# Patient Record
Sex: Female | Born: 1965 | Race: Black or African American | Hispanic: No | Marital: Single | Smoking: Never smoker
Health system: Southern US, Community
[De-identification: ages and names within clinical notes are randomized; demographics above are authoritative.]

## PROBLEM LIST (undated history)

## (undated) ENCOUNTER — Ambulatory Visit (HOSPITAL_COMMUNITY): Admission: EM | Discharge status: Absent without leave | Payer: 59

## (undated) DIAGNOSIS — D649 Anemia, unspecified: Secondary | ICD-10-CM

## (undated) DIAGNOSIS — T7840XA Allergy, unspecified, initial encounter: Secondary | ICD-10-CM

## (undated) DIAGNOSIS — Z86010 Personal history of colonic polyps: Secondary | ICD-10-CM

## (undated) HISTORY — PX: OTHER SURGICAL HISTORY: SHX169

## (undated) HISTORY — DX: Anemia, unspecified: D64.9

## (undated) HISTORY — DX: Personal history of colonic polyps: Z86.010

## (undated) HISTORY — DX: Allergy, unspecified, initial encounter: T78.40XA

---

## 2002-05-20 ENCOUNTER — Other Ambulatory Visit: Admission: RE | Admit: 2002-05-20 | Discharge: 2002-05-20 | Payer: Self-pay | Admitting: Obstetrics and Gynecology

## 2003-06-05 ENCOUNTER — Encounter: Payer: Self-pay | Admitting: Obstetrics and Gynecology

## 2003-06-05 ENCOUNTER — Encounter: Admission: RE | Admit: 2003-06-05 | Discharge: 2003-06-05 | Payer: Self-pay | Admitting: Obstetrics and Gynecology

## 2003-07-25 ENCOUNTER — Encounter: Payer: Self-pay | Admitting: Obstetrics and Gynecology

## 2003-07-25 ENCOUNTER — Encounter: Admission: RE | Admit: 2003-07-25 | Discharge: 2003-07-25 | Payer: Self-pay | Admitting: Obstetrics and Gynecology

## 2004-07-01 ENCOUNTER — Other Ambulatory Visit: Admission: RE | Admit: 2004-07-01 | Discharge: 2004-07-01 | Payer: Self-pay | Admitting: Obstetrics and Gynecology

## 2005-05-15 ENCOUNTER — Encounter: Admission: RE | Admit: 2005-05-15 | Discharge: 2005-05-15 | Payer: Self-pay | Admitting: Obstetrics and Gynecology

## 2005-12-31 ENCOUNTER — Encounter: Admission: RE | Admit: 2005-12-31 | Discharge: 2005-12-31 | Payer: Self-pay | Admitting: Obstetrics and Gynecology

## 2006-01-19 ENCOUNTER — Observation Stay (HOSPITAL_COMMUNITY): Admission: RE | Admit: 2006-01-19 | Discharge: 2006-01-20 | Payer: Self-pay | Admitting: Interventional Radiology

## 2006-01-28 ENCOUNTER — Encounter: Admission: RE | Admit: 2006-01-28 | Discharge: 2006-01-28 | Payer: Self-pay | Admitting: Interventional Radiology

## 2006-02-20 ENCOUNTER — Encounter: Admission: RE | Admit: 2006-02-20 | Discharge: 2006-02-20 | Payer: Self-pay | Admitting: Interventional Radiology

## 2006-05-14 ENCOUNTER — Encounter: Admission: RE | Admit: 2006-05-14 | Discharge: 2006-05-14 | Payer: Self-pay | Admitting: Obstetrics and Gynecology

## 2006-07-08 ENCOUNTER — Encounter: Admission: RE | Admit: 2006-07-08 | Discharge: 2006-07-08 | Payer: Self-pay | Admitting: Obstetrics and Gynecology

## 2006-08-20 ENCOUNTER — Encounter: Admission: RE | Admit: 2006-08-20 | Discharge: 2006-08-20 | Payer: Self-pay | Admitting: Interventional Radiology

## 2007-03-24 ENCOUNTER — Encounter: Admission: RE | Admit: 2007-03-24 | Discharge: 2007-03-24 | Payer: Self-pay | Admitting: Interventional Radiology

## 2007-05-07 ENCOUNTER — Encounter: Admission: RE | Admit: 2007-05-07 | Discharge: 2007-05-07 | Payer: Self-pay | Admitting: Internal Medicine

## 2010-03-12 ENCOUNTER — Encounter: Payer: Self-pay | Admitting: Internal Medicine

## 2010-03-20 ENCOUNTER — Encounter: Payer: Self-pay | Admitting: Internal Medicine

## 2010-03-26 ENCOUNTER — Encounter: Payer: Self-pay | Admitting: Internal Medicine

## 2010-03-27 ENCOUNTER — Encounter (INDEPENDENT_AMBULATORY_CARE_PROVIDER_SITE_OTHER): Payer: Self-pay | Admitting: *Deleted

## 2010-03-29 ENCOUNTER — Ambulatory Visit: Payer: Self-pay | Admitting: Internal Medicine

## 2010-03-29 DIAGNOSIS — D649 Anemia, unspecified: Secondary | ICD-10-CM | POA: Insufficient documentation

## 2010-04-04 ENCOUNTER — Ambulatory Visit: Payer: Self-pay | Admitting: Internal Medicine

## 2010-04-05 LAB — CONVERTED CEMR LAB
Fecal Occult Blood: NEGATIVE
OCCULT 1: NEGATIVE

## 2010-09-27 ENCOUNTER — Emergency Department (HOSPITAL_COMMUNITY)
Admission: EM | Admit: 2010-09-27 | Discharge: 2010-09-27 | Payer: Self-pay | Source: Home / Self Care | Admitting: Emergency Medicine

## 2010-11-09 ENCOUNTER — Encounter: Payer: Self-pay | Admitting: Interventional Radiology

## 2010-11-10 ENCOUNTER — Encounter: Payer: Self-pay | Admitting: Obstetrics and Gynecology

## 2010-11-19 NOTE — Letter (Signed)
Summary: Pt Medical Hx/[facility]  Pt Medical Hx/[facility]   Imported By: Sherian Rein 04/03/2010 13:55:41  _____________________________________________________________________  External Attachment:    Type:   Image     Comment:   External Document

## 2010-11-19 NOTE — Assessment & Plan Note (Signed)
Summary: ANEMIA/YF   History of Present Illness Visit Type: Initial Consult Primary GI MD: Stan Head MD [facility] Primary Provider: Creola Corn, MD Requesting Provider: Creola Corn, MD Chief Complaint: anemia History of Present Illness:   45 yo African-american woman found to be anemic. Hemosure test was negative for occult blood. she has a history of anemia due to menorrhagia that was treated with endometrial ablation, successfully (2007). she is describing some RUQ pains intermittently associated with twisting or bending for 6 years    GI Review of Systems    Reports abdominal pain.     Location of  Abdominal pain: RUQ.    Denies acid reflux, belching, bloating, chest pain, dysphagia with liquids, dysphagia with solids, heartburn, loss of appetite, nausea, vomiting, vomiting blood, weight loss, and  weight gain.        Denies anal fissure, black tarry stools, change in bowel habit, constipation, diarrhea, diverticulosis, fecal incontinence, heme positive stool, hemorrhoids, irritable bowel syndrome, jaundice, light color stool, liver problems, rectal bleeding, and  rectal pain. Preventive Screening-Counseling & Management      Drug Use:  no.      Current Medications (verified): 1)  Multivitamins  Tabs (Multiple Vitamin) .... Take 1 Tablet By Mouth Once A Day 2)  Wheat Germ Oil  Caps (Wheat Germ Oil) .Marland Kitchen.. 1 By Mouth Once Daily - Every Other Day 3)  Vitamin D3 5000 Unit Caps (Cholecalciferol) .Marland Kitchen.. 1 By Mouth Once Daily 4)  Biotin 5000 5 Mg Caps (Biotin) .Marland Kitchen.. 1 By Mouth As Needed  Allergies (verified): No Known Drug Allergies  Past History:  Past Medical History: Reviewed history from 03/28/2010 and no changes required. Anemia Arthritis Atrial Fibrillation Obesity Ventricular Hypertrophy  Past Surgical History: Reviewed history from 03/28/2010 and no changes required. C-Section x 3 Fibroid embolization  Family History: Reviewed history from 03/28/2010 and no changes  required. Family History of Mouth Cancer: Father No FH of Colon Cancer:  Social History: Reviewed history from 03/28/2010 and no changes required. 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  Alcohol Use - yes-once a month Illicit Drug Use - no Drug Use:  no  Review of Systems       The patient complains of cough.         All other ROS negative except as per HPI.   Vital Signs:  Patient profile:   45 year old female Height:      65 inches Weight:      233 pounds BMI:     38.91 Pulse rate:   80 / minute Pulse rhythm:   regular BP sitting:   118 / 76  (left arm)  Vitals Entered By: Milford Cage NCMA (March 29, 2010 10:30 AM)  Physical Exam  General:  obese.  NAD Eyes:  PERRLA, no icterus. Mouth:  No deformity or lesions, dentition normal. Neck:  Supple; no masses or thyromegaly. Lungs:  Clear throughout to auscultation. Heart:  Regular rate and rhythm; no murmurs, rubs,  or bruits. Abdomen:  Soft, nontender and nondistended. No masses, hepatosplenomegaly or hernias noted. Normal bowel sounds. Obese Extremities:  No clubbing, cyanosis, edema or deformities noted. Neurologic:  Alert and  oriented x4;  grossly normal neurologically. Cervical Nodes:  No significant cervical or supraclavicular adenopathy.  Psych:  Alert and cooperative. Normal mood and affect.   Impression & Recommendations:  Problem # 1:  ANEMIA, MILD (ICD-285.9) Assessment New She has a mild chronic anemia with Hgb in 11 range. Hemosure is negative (  suggests no colonic occult bleeding), iron studies ok, and B12, Folate, retics, Hgb electrophoresis have been done. We will follow-up on those results and alos have her do guaic-based occult blood testing to look for bleeding from upper GI tract and small bowel that would not be picked by Hemosure iFOBT.  At this point there is no indication for an endoscopic evaluation.  Orders: [facility] Hemoccult Cards #6 (take home) (Hem  Cards #6)  Problem # 2:  SCREENING, COLON CANCER (ICD-V76.51) Assessment: New A colonoscopy at 44 in African-Americans is reasonable given ACG recommendations and the earlier onset of polyps/cancer of colon in blacks. Await other testing, etc. she will be 44 in 2 months.  Patient Instructions: 1)  Return Hemoccult cards to lab.  We will call you with results and follow up. 2)  Copy sent to : Creola Corn, MD 3)  The medication list was reviewed and reconciled.  All changed / newly prescribed medications were explained.  A complete medication list was provided to the patient / caregiver.

## 2010-11-19 NOTE — Letter (Signed)
Summary: [facility]  [facility]   Imported By: Sherian Rein 04/03/2010 13:53:59  _____________________________________________________________________  External Attachment:    Type:   Image     Comment:   External Document

## 2010-11-19 NOTE — Letter (Signed)
Summary: New Patient letter  [facility]  [address] [place], [place] [address]   Phone: [phone]  Fax: [phone]       03/27/2010 MRN: [id number]  Meghan Robertson [address] [place], [place]  [address]  Dear Meghan Robertson,  Welcome to the Gastroenterology Division at [facility].    You are scheduled to see Dr.  Leone Payor on 03-29-10 at 10am on the 3rd floor at [facility], [address]  We ask that you try to arrive at our office 15 minutes prior to your appointment time to allow for check-in.  We would like you to complete the enclosed self-administered evaluation form prior to your visit and bring it with you on the day of your appointment.  We will review it with you.  Also, please bring a complete list of all your medications or, if you prefer, bring the medication bottles and we will list them.  Please bring your insurance card so that we may make a copy of it.  If your insurance requires a referral to see a specialist, please bring your referral form from your primary care physician.  Co-payments are due at the time of your visit and may be paid by cash, check or credit card.     Your office visit will consist of a consult with your physician (includes a physical exam), any laboratory testing he/she may order, scheduling of any necessary diagnostic testing (e.g. x-ray, ultrasound, CT-scan), and scheduling of a procedure (e.g. Endoscopy, Colonoscopy) if required.  Please allow enough time on your schedule to allow for any/all of these possibilities.    If you cannot keep your appointment, please call [phone] to cancel or reschedule prior to your appointment date.  This allows [place] the opportunity to schedule an appointment for another patient in need of care.  If you do not cancel or reschedule by 5 p.m. the business day prior to your appointment date, you will be charged a $50.00 late cancellation/no-show fee.    Thank you for choosing [facility] for your medical needs.  We appreciate the opportunity to care for you.  Please visit [place] at our website  to learn more about our practice.                     Sincerely,                                                             The Gastroenterology Division

## 2013-08-19 ENCOUNTER — Other Ambulatory Visit: Payer: Self-pay

## 2013-08-19 ENCOUNTER — Encounter (HOSPITAL_COMMUNITY): Payer: Self-pay | Admitting: Emergency Medicine

## 2013-08-19 ENCOUNTER — Emergency Department (HOSPITAL_COMMUNITY)
Admission: EM | Admit: 2013-08-19 | Discharge: 2013-08-20 | Disposition: A | Discharge status: Home / Self Care | Payer: Managed Care, Other (non HMO) | Attending: Emergency Medicine | Admitting: Emergency Medicine

## 2013-08-19 ENCOUNTER — Emergency Department (HOSPITAL_COMMUNITY): Payer: Managed Care, Other (non HMO)

## 2013-08-19 DIAGNOSIS — Z3202 Encounter for pregnancy test, result negative: Secondary | ICD-10-CM | POA: Insufficient documentation

## 2013-08-19 DIAGNOSIS — R079 Chest pain, unspecified: Secondary | ICD-10-CM | POA: Insufficient documentation

## 2013-08-19 DIAGNOSIS — Z79899 Other long term (current) drug therapy: Secondary | ICD-10-CM | POA: Insufficient documentation

## 2013-08-19 DIAGNOSIS — M549 Dorsalgia, unspecified: Secondary | ICD-10-CM | POA: Insufficient documentation

## 2013-08-19 LAB — URINALYSIS, ROUTINE W REFLEX MICROSCOPIC
Bilirubin Urine: NEGATIVE
Glucose, UA: NEGATIVE mg/dL
Ketones, ur: NEGATIVE mg/dL
Leukocytes, UA: NEGATIVE
Nitrite: NEGATIVE
Protein, ur: NEGATIVE mg/dL
Specific Gravity, Urine: 1.032 — ABNORMAL HIGH (ref 1.005–1.030)
Urobilinogen, UA: 1 mg/dL (ref 0.0–1.0)
pH: 5.5 (ref 5.0–8.0)

## 2013-08-19 LAB — POCT PREGNANCY, URINE: Preg Test, Ur: NEGATIVE

## 2013-08-19 LAB — CBC WITH DIFFERENTIAL/PLATELET
Basophils Absolute: 0 10*3/uL (ref 0.0–0.1)
Eosinophils Relative: 4 % (ref 0–5)
Hemoglobin: 11.8 g/dL — ABNORMAL LOW (ref 12.0–15.0)
Lymphocytes Relative: 38 % (ref 12–46)
Lymphs Abs: 2.8 10*3/uL (ref 0.7–4.0)
MCHC: 33.2 g/dL (ref 30.0–36.0)
Monocytes Absolute: 0.4 10*3/uL (ref 0.1–1.0)
Monocytes Relative: 6 % (ref 3–12)
Neutrophils Relative %: 53 % (ref 43–77)

## 2013-08-19 LAB — POCT I-STAT, CHEM 8
Calcium, Ion: 1.23 mmol/L (ref 1.12–1.23)
Chloride: 105 mEq/L (ref 96–112)
HCT: 38 % (ref 36.0–46.0)
Hemoglobin: 12.9 g/dL (ref 12.0–15.0)
Sodium: 141 mEq/L (ref 135–145)

## 2013-08-19 LAB — COMPREHENSIVE METABOLIC PANEL
ALT: 22 U/L (ref 0–35)
AST: 22 U/L (ref 0–37)
Calcium: 9.9 mg/dL (ref 8.4–10.5)
Chloride: 102 mEq/L (ref 96–112)
Sodium: 137 mEq/L (ref 135–145)
Total Bilirubin: 0.2 mg/dL — ABNORMAL LOW (ref 0.3–1.2)
Total Protein: 8 g/dL (ref 6.0–8.3)

## 2013-08-19 MED ORDER — GI COCKTAIL ~~LOC~~
30.0000 mL | Freq: Once | ORAL | Status: AC
  Administered 2013-08-19: 30 mL via ORAL
  Filled 2013-08-19: qty 30

## 2013-08-19 MED ORDER — OMEPRAZOLE 20 MG PO CPDR: DELAYED_RELEASE_CAPSULE | ORAL | Status: DC

## 2013-08-19 NOTE — ED Notes (Signed)
Patient transported to X-ray 

## 2013-08-19 NOTE — ED Provider Notes (Signed)
CSN: [id number]     Arrival date & time 08/19/13  2138 History   First MD Initiated Contact with Patient 08/19/13 2224     Chief Complaint  Patient presents with  . Chest Pain  . Back Pain   (Consider location/radiation/quality/duration/timing/severity/associated sxs/prior Treatment) HPI Comments: Patient with no significant past medical history presents with complaint of chest pain with radiation to her back that is been intermittent for one year but constant for the past 3 days. Patient is described as being in the middle of her chest radiating to her middle back and is burning in nature. She denies vomiting but has had mild nausea. No diarrhea or urinary symptoms. No shortness of breath or palpitations. Patient denies risk factors for PE including recent immobilizations, history of blood clots, estrogen use, lower extremity swelling, travel. Patient denies history of high blood pressure, high cholesterol, diabetes, smoking. No family history of heart attack or heart disease. Patient has taken aspirin for the pain but does not take every day. Patient has taken cayenne pepper with some relief. Patient has not noted change of pain with food or deep breathing. She does not endorse heavy NSAID use and does not drink alcohol. The onset of this condition was acute. The course is constant. Aggravating factors: none.   The history is provided by the patient.    History reviewed. No pertinent past medical history. Past Surgical History  Procedure Laterality Date  . Cesarean section      x 3  . Embolization      fibroid   No family history on file. History  Substance Use Topics  . Smoking status: Never Smoker   . Smokeless tobacco: Not on file  . Alcohol Use: No   OB History   Grav Para Term Preterm Abortions TAB SAB Ect Mult Living                 Review of Systems  Constitutional: Negative for fever and diaphoresis.  Eyes: Negative for redness.  Respiratory: Negative for cough and  shortness of breath.   Cardiovascular: Positive for chest pain. Negative for palpitations and leg swelling.  Gastrointestinal: Negative for nausea, vomiting and abdominal pain.  Genitourinary: Negative for dysuria.  Musculoskeletal: Positive for back pain. Negative for neck pain.  Skin: Negative for rash.  Neurological: Negative for syncope and light-headedness.    Allergies  Review of patient's allergies indicates no known allergies.  Home Medications   Current Outpatient Rx  Name  Route  Sig  Dispense  Refill  . aspirin 81 MG chewable tablet   Oral   Chew 81 mg by mouth once as needed (chest pain).         . cholecalciferol (VITAMIN D) 1000 UNITS tablet   Oral   Take 1,000 Units by mouth every morning.         . vitamin C (ASCORBIC ACID) 500 MG tablet   Oral   Take 500 mg by mouth every morning.          BP 125/75  Pulse 71  Temp(Src) 98.7 F (37.1 C) (Oral)  Resp 18  Ht 5\' 5"  (1.651 m)  Wt 235 lb (106.595 kg)  BMI 39.11 kg/m2  SpO2 100% Physical Exam  Nursing note and vitals reviewed. Constitutional: She appears well-developed and well-nourished.  HENT:  Head: Normocephalic and atraumatic.  Mouth/Throat: Mucous membranes are normal. Mucous membranes are not dry.  Eyes: Conjunctivae are normal.  Neck: Trachea normal and normal range of motion.  Neck supple. Normal carotid pulses and no JVD present. No muscular tenderness present. Carotid bruit is not present. No tracheal deviation present.  Cardiovascular: Normal rate, regular rhythm, S1 normal, S2 normal, normal heart sounds and intact distal pulses.  Exam reveals no decreased pulses.   No murmur heard. Pulmonary/Chest: Effort normal. No respiratory distress. She has no wheezes. She exhibits no tenderness.  Abdominal: Soft. Normal aorta and bowel sounds are normal. There is no tenderness. There is no rebound and no guarding.  Musculoskeletal: Normal range of motion. She exhibits no edema and no tenderness.   Neurological: She is alert.  Skin: Skin is warm and dry. She is not diaphoretic. No cyanosis. No pallor.  Psychiatric: She has a normal mood and affect.    ED Course  Procedures (including critical care time) Labs Review Labs Reviewed  CBC WITH DIFFERENTIAL - Abnormal; Notable for the following:    Hemoglobin 11.8 (*)    HCT 35.5 (*)    All other components within normal limits  URINALYSIS, ROUTINE W REFLEX MICROSCOPIC - Abnormal; Notable for the following:    Specific Gravity, Urine 1.032 (*)    All other components within normal limits  COMPREHENSIVE METABOLIC PANEL - Abnormal; Notable for the following:    Total Bilirubin 0.2 (*)    All other components within normal limits  LIPASE, BLOOD - Abnormal; Notable for the following:    Lipase 68 (*)    All other components within normal limits  POCT PREGNANCY, URINE  POCT I-STAT, CHEM 8  POCT I-STAT TROPONIN I   Imaging Review Dg Chest 2 View  08/19/2013   CLINICAL DATA:  Chest pain  EXAM: CHEST  2 VIEW  COMPARISON:  None.  FINDINGS: The heart size and mediastinal contours are within normal limits. Both lungs are clear of edema or consolidation. No effusion or pneumothorax. The visualized skeletal structures are unremarkable.  IMPRESSION: No evidence of active cardiopulmonary disease.   Electronically Signed   By: Tiburcio Pea M.D.   On: 08/19/2013 23:06    EKG Interpretation     Ventricular Rate:  76 PR Interval:  154 QRS Duration: 83 QT Interval:  391 QTC Calculation: 440 R Axis:   76 Text Interpretation:  Sinus rhythm           Patient seen and examined. Work-up initiated. Medications ordered.   Vital signs reviewed and are as follows: Filed Vitals:   08/19/13 2345  BP: 111/55  Pulse:   Temp:   Resp: 18  BP 111/55  Pulse 71  Temp(Src) 98.7 F (37.1 C) (Oral)  Resp 18  Ht 5\' 5"  (1.651 m)  Wt 235 lb (106.595 kg)  BMI 39.11 kg/m2  SpO2 100%  11:50 PM Patient states that GI cocktail made her a  little nauseous, did not change pain. Informed of all results. Offered pain medicine, she refuses. Patient wants to go home.   Will start on PPI. Will give f/u with [facility]. She does not want pain meds for home.   Patient was counseled to return with severe chest pain, especially if the pain is crushing or pressure-like and spreads to the arms, back, neck, or jaw, or if they have sweating, nausea, or shortness of breath with the pain. They were encouraged to call 911 with these symptoms.   They were also told to return if their chest pain gets worse and does not go away with rest, they have an attack of chest pain lasting longer than  usual despite rest and treatment with the medications their caregiver has prescribed, if they wake from sleep with chest pain or shortness of breath, if they feel dizzy or faint, if they have chest pain not typical of their usual pain, or if they have any other emergent concerns regarding their health.  The patient verbalized understanding and agreed.     MDM   1. Chest pain    Patient with chest pain, constant x 72 hrs, intermittent for past year. Doubt cardiac origin of pain: poor story, normal EKG, neg trop. Doubt PE, pt is PERC negative.CXR neg. Slight elevation in lipase but history not suggestive of pancreatitis. Cholelithiasis is possible but no change in pain with food. PUD also possible. Will start on PPI empirically. No emergency medical conditions are suspected. Pt to f/u with [facility] for further eval.    Renne Crigler, PA-C 08/19/13 2357

## 2013-08-19 NOTE — ED Notes (Signed)
Pt c/o substernal burning, aching radiating through to back x 3 days. Pt states she has had periods of diaphoresis with SHOB.

## 2013-08-22 NOTE — ED Provider Notes (Signed)
Medical screening examination/treatment/procedure(s) were performed by non-physician practitioner and as supervising physician I was immediately available for consultation/collaboration.  EKG Interpretation     Ventricular Rate:  76 PR Interval:  154 QRS Duration: 83 QT Interval:  391 QTC Calculation: 440 R Axis:   76 Text Interpretation:  Sinus rhythm              Laray Anger, DO 08/22/13 0123

## 2013-10-11 ENCOUNTER — Ambulatory Visit: Payer: Self-pay | Admitting: Internal Medicine

## 2013-10-19 ENCOUNTER — Ambulatory Visit: Payer: Self-pay

## 2013-12-22 ENCOUNTER — Other Ambulatory Visit (HOSPITAL_COMMUNITY)
Admission: RE | Admit: 2013-12-22 | Discharge: 2013-12-22 | Disposition: A | Discharge status: Home / Self Care | Payer: Managed Care, Other (non HMO) | Source: Ambulatory Visit | Attending: Obstetrics and Gynecology | Admitting: Obstetrics and Gynecology

## 2013-12-22 ENCOUNTER — Other Ambulatory Visit: Payer: Self-pay | Admitting: Obstetrics and Gynecology

## 2013-12-22 DIAGNOSIS — Z1151 Encounter for screening for human papillomavirus (HPV): Secondary | ICD-10-CM | POA: Insufficient documentation

## 2013-12-22 DIAGNOSIS — Z01419 Encounter for gynecological examination (general) (routine) without abnormal findings: Secondary | ICD-10-CM | POA: Insufficient documentation

## 2016-02-06 ENCOUNTER — Other Ambulatory Visit: Payer: Self-pay

## 2016-02-06 DIAGNOSIS — Z1231 Encounter for screening mammogram for malignant neoplasm of breast: Secondary | ICD-10-CM

## 2016-02-11 ENCOUNTER — Other Ambulatory Visit: Payer: Self-pay | Admitting: Obstetrics and Gynecology

## 2016-02-11 ENCOUNTER — Other Ambulatory Visit (HOSPITAL_COMMUNITY)
Admission: RE | Admit: 2016-02-11 | Discharge: 2016-02-11 | Disposition: A | Discharge status: Home / Self Care | Payer: 59 | Source: Ambulatory Visit | Attending: Obstetrics and Gynecology | Admitting: Obstetrics and Gynecology

## 2016-02-11 DIAGNOSIS — Z01419 Encounter for gynecological examination (general) (routine) without abnormal findings: Secondary | ICD-10-CM | POA: Diagnosis present

## 2016-02-12 LAB — CYTOLOGY - PAP

## 2016-02-18 ENCOUNTER — Other Ambulatory Visit: Payer: Self-pay | Admitting: Internal Medicine

## 2016-02-18 DIAGNOSIS — R229 Localized swelling, mass and lump, unspecified: Principal | ICD-10-CM

## 2016-02-18 DIAGNOSIS — IMO0002 Reserved for concepts with insufficient information to code with codable children: Secondary | ICD-10-CM

## 2016-02-27 ENCOUNTER — Ambulatory Visit
Admission: RE | Admit: 2016-02-27 | Discharge: 2016-02-27 | Disposition: A | Discharge status: Home / Self Care | Payer: 59 | Source: Ambulatory Visit | Attending: Internal Medicine | Admitting: Internal Medicine

## 2016-02-27 ENCOUNTER — Ambulatory Visit
Admission: RE | Admit: 2016-02-27 | Discharge: 2016-02-27 | Disposition: A | Discharge status: Home / Self Care | Payer: 59 | Source: Ambulatory Visit

## 2016-02-27 DIAGNOSIS — Z1231 Encounter for screening mammogram for malignant neoplasm of breast: Secondary | ICD-10-CM

## 2016-02-27 DIAGNOSIS — R229 Localized swelling, mass and lump, unspecified: Principal | ICD-10-CM

## 2016-02-27 DIAGNOSIS — IMO0002 Reserved for concepts with insufficient information to code with codable children: Secondary | ICD-10-CM

## 2016-12-24 ENCOUNTER — Other Ambulatory Visit: Payer: Self-pay | Admitting: Occupational Medicine

## 2016-12-24 ENCOUNTER — Ambulatory Visit: Payer: Self-pay

## 2016-12-24 DIAGNOSIS — M549 Dorsalgia, unspecified: Secondary | ICD-10-CM

## 2017-02-16 ENCOUNTER — Encounter: Payer: Self-pay | Admitting: Internal Medicine

## 2017-03-02 ENCOUNTER — Ambulatory Visit: Payer: No Typology Code available for payment source | Admitting: Podiatry

## 2017-03-19 ENCOUNTER — Ambulatory Visit: Payer: No Typology Code available for payment source | Admitting: Podiatry

## 2017-03-26 ENCOUNTER — Other Ambulatory Visit: Payer: Self-pay | Admitting: Specialist

## 2017-03-26 DIAGNOSIS — M5431 Sciatica, right side: Secondary | ICD-10-CM

## 2017-04-02 ENCOUNTER — Other Ambulatory Visit: Payer: Self-pay

## 2017-04-03 ENCOUNTER — Inpatient Hospital Stay
Admission: RE | Admit: 2017-04-03 | Discharge: 2017-04-03 | Disposition: A | Discharge status: Home / Self Care | Payer: Self-pay | Source: Ambulatory Visit | Attending: Specialist | Admitting: Specialist

## 2017-04-07 ENCOUNTER — Inpatient Hospital Stay
Admission: RE | Admit: 2017-04-07 | Discharge: 2017-04-07 | Disposition: A | Discharge status: Home / Self Care | Payer: Self-pay | Source: Ambulatory Visit | Attending: Specialist | Admitting: Specialist

## 2017-04-07 NOTE — Discharge Instructions (Signed)

## 2017-04-08 ENCOUNTER — Encounter: Payer: Self-pay | Admitting: Podiatry

## 2017-04-08 ENCOUNTER — Ambulatory Visit (INDEPENDENT_AMBULATORY_CARE_PROVIDER_SITE_OTHER): Payer: No Typology Code available for payment source | Admitting: Podiatry

## 2017-04-08 VITALS — BP 138/83 | HR 75 | Resp 16 | Ht 65.0 in | Wt 240.0 lb

## 2017-04-08 DIAGNOSIS — B351 Tinea unguium: Secondary | ICD-10-CM | POA: Diagnosis not present

## 2017-04-08 LAB — HEPATIC FUNCTION PANEL
ALBUMIN: 4 g/dL (ref 3.6–5.1)
ALT: 15 U/L (ref 6–29)
AST: 15 U/L (ref 10–35)
Alkaline Phosphatase: 56 U/L (ref 33–130)
BILIRUBIN DIRECT: 0.1 mg/dL (ref <=0.2)
BILIRUBIN TOTAL: 0.4 mg/dL (ref 0.2–1.2)
Indirect Bilirubin: 0.3 mg/dL (ref 0.2–1.2)
Total Protein: 7.1 g/dL (ref 6.1–8.1)

## 2017-04-08 MED ORDER — TERBINAFINE HCL 250 MG PO TABS: 250.0000 mg | ORAL_TABLET | Freq: Every day | ORAL | 0 refills | Status: DC

## 2017-04-08 NOTE — Progress Notes (Signed)
   Subjective:    Patient ID: Meghan Robertson, female    DOB: [birth year], 51 y.o.   MRN: [id number]  HPI Chief Complaint  Patient presents with  . Nail problrem    Bilateral; great toes; nail discoloration and thickened nail  . Skin condition    Left foot; skin peeling; itching; x1 yr      Review of Systems  Eyes: Positive for visual disturbance.  Musculoskeletal: Positive for back pain.  All other systems reviewed and are negative.      Objective:   Physical Exam        Assessment & Plan:

## 2017-04-08 NOTE — Progress Notes (Signed)
Subjective:    Patient ID: Meghan Robertson, female   DOB: 51 y.o.   MRN: [id number]   HPI patient states that she has significant fungal infection of the left hallux nail and it's she crusted skin on the left foot and several other nails that have yellow like discoloration.    Review of Systems  All other systems reviewed and are negative.       Objective:  Physical Exam  Cardiovascular: Intact distal pulses.   Musculoskeletal: Normal range of motion.  Neurological: She is alert.  Skin: Skin is warm.  Nursing note and vitals reviewed.  neurovascular status intact muscle strength adequate range of motion within normal limits with patient found to have should've a number of nails with the left hallux being worse with skin irritation left which is probably fungal in nature. 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 I've recommended antifungal and patient will do a full 90 day treatment I did explain procedure risk and we will get liver function studies done prior to initiation of treatment. I then recommended at least for laser treatments to be done focusing on the left hallux of several other nails will be touched up. Patient will be seen back for this procedure and also we'll start topical medication

## 2017-04-10 ENCOUNTER — Ambulatory Visit (AMBULATORY_SURGERY_CENTER): Payer: Self-pay | Admitting: *Deleted

## 2017-04-10 VITALS — Ht 65.0 in | Wt 242.6 lb

## 2017-04-10 DIAGNOSIS — Z1211 Encounter for screening for malignant neoplasm of colon: Secondary | ICD-10-CM

## 2017-04-10 NOTE — Progress Notes (Signed)
No egg or soy allergy known to patient  No issues with past sedation with any surgeries  or procedures, no intubation problems  No diet pills per patient No home 02 use per patient  No blood thinners per patient  Pt denies issues with constipation  No A fib or A flutter  EMMI video sent to pt's e mail  

## 2017-04-14 ENCOUNTER — Encounter: Payer: Self-pay | Admitting: Internal Medicine

## 2017-04-20 ENCOUNTER — Encounter: Payer: Self-pay | Admitting: Internal Medicine

## 2017-04-24 ENCOUNTER — Ambulatory Visit: Payer: No Typology Code available for payment source

## 2017-04-28 ENCOUNTER — Encounter: Payer: Self-pay | Admitting: Internal Medicine

## 2017-06-12 ENCOUNTER — Encounter: Payer: Self-pay | Admitting: Internal Medicine

## 2017-07-10 ENCOUNTER — Other Ambulatory Visit: Payer: Self-pay | Admitting: Internal Medicine

## 2017-07-10 DIAGNOSIS — Z1231 Encounter for screening mammogram for malignant neoplasm of breast: Secondary | ICD-10-CM

## 2017-07-16 ENCOUNTER — Ambulatory Visit
Admission: RE | Admit: 2017-07-16 | Discharge: 2017-07-16 | Disposition: A | Discharge status: Home / Self Care | Payer: PRIVATE HEALTH INSURANCE | Source: Ambulatory Visit | Attending: Internal Medicine | Admitting: Internal Medicine

## 2017-07-16 DIAGNOSIS — Z1231 Encounter for screening mammogram for malignant neoplasm of breast: Secondary | ICD-10-CM

## 2017-07-21 ENCOUNTER — Encounter: Payer: Self-pay | Admitting: Internal Medicine

## 2017-08-02 IMAGING — CR DG LUMBAR SPINE COMPLETE 4+V
5 series · 5 of 5 positions shown · non-contrast
Comparison: Lumbar spine films of 09/27/2010

CLINICAL DATA: Injured lifting several weeks ago with persistent
low back pain

EXAM:
LUMBAR SPINE - COMPLETE 4+ VIEW

[view not recorded (1 of 5)]
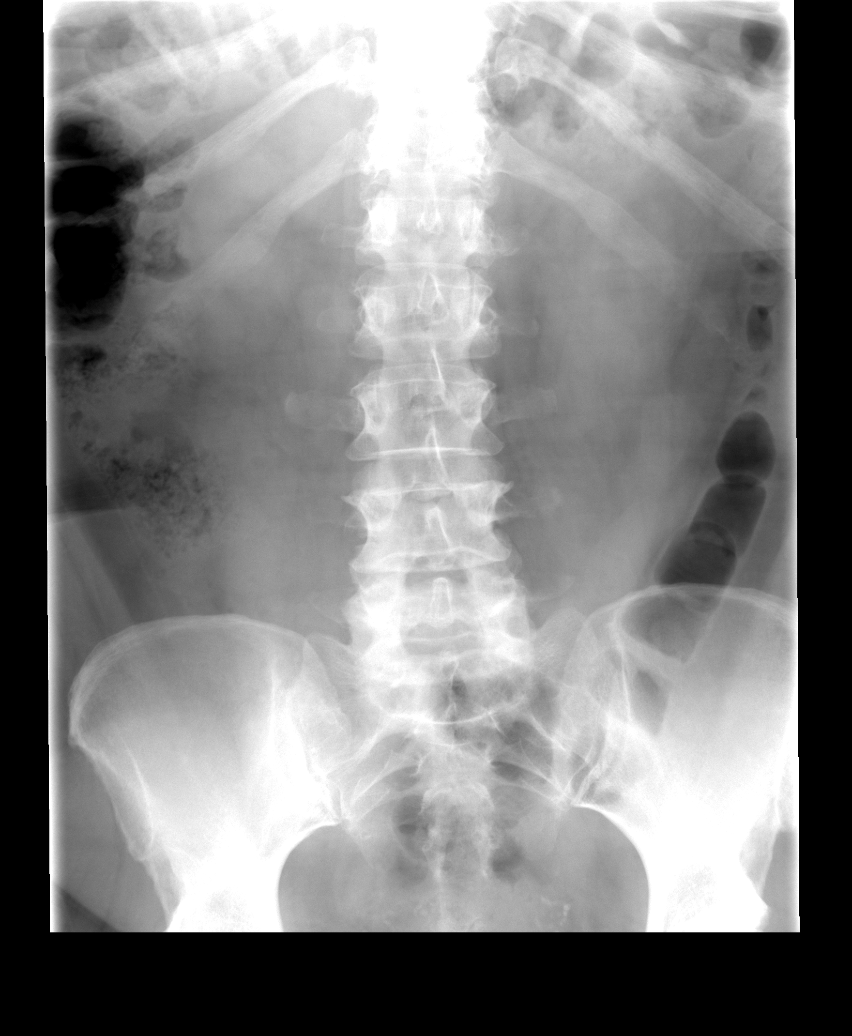

[view not recorded (2 of 5)]
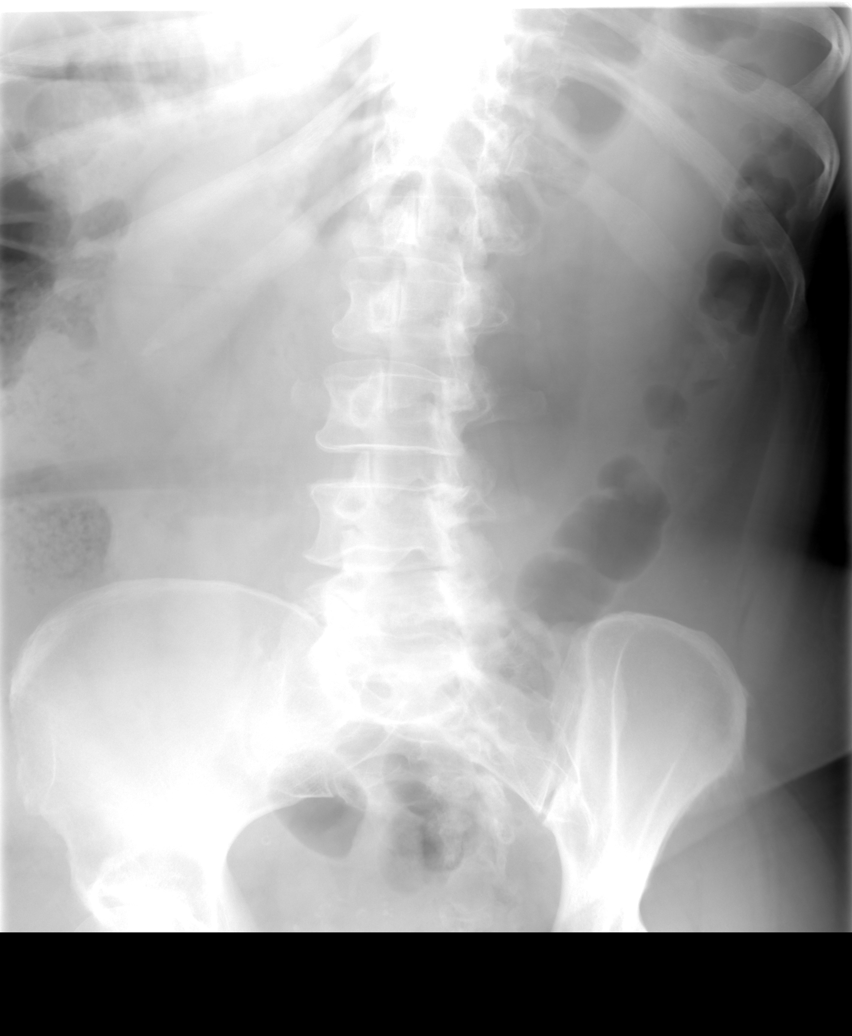

[view not recorded (3 of 5)]
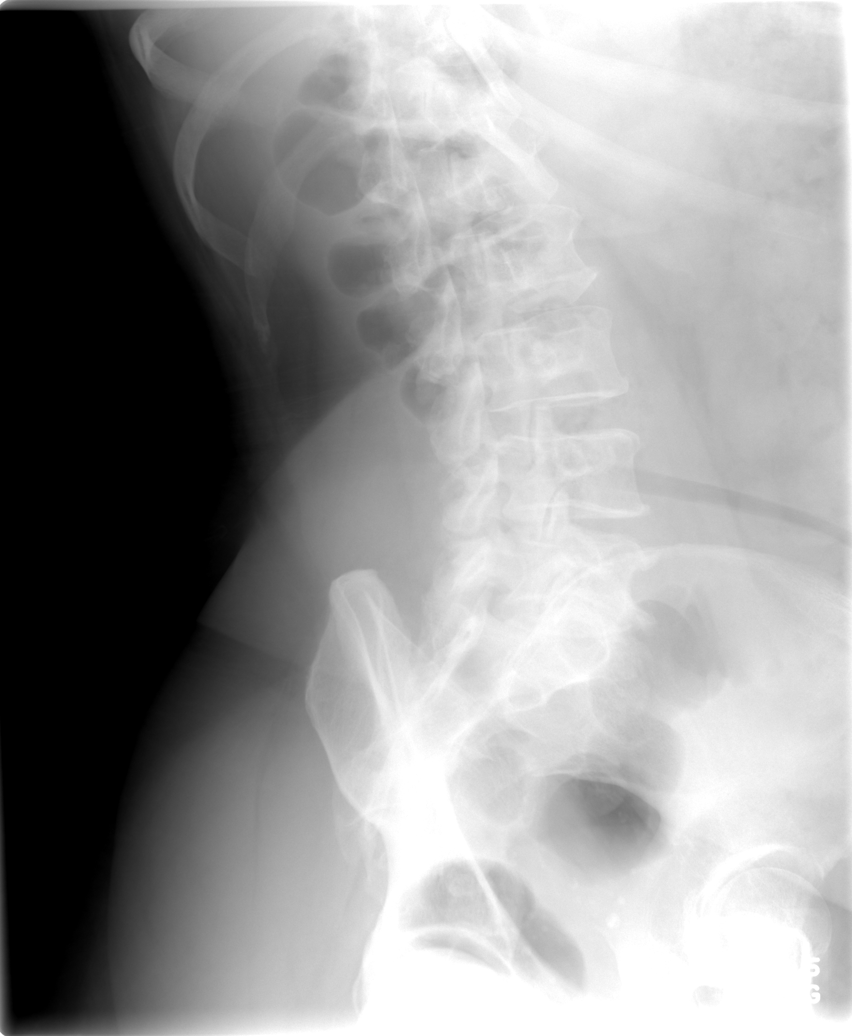

[view not recorded (4 of 5)]
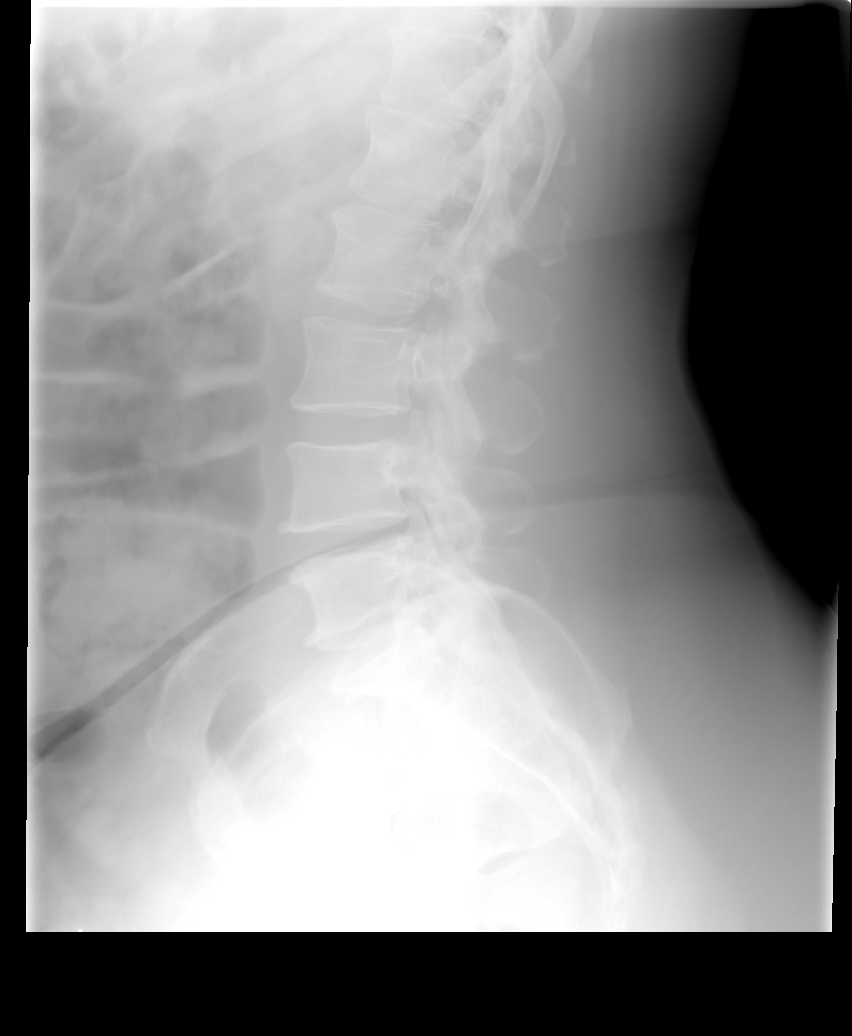

[view not recorded (5 of 5)]
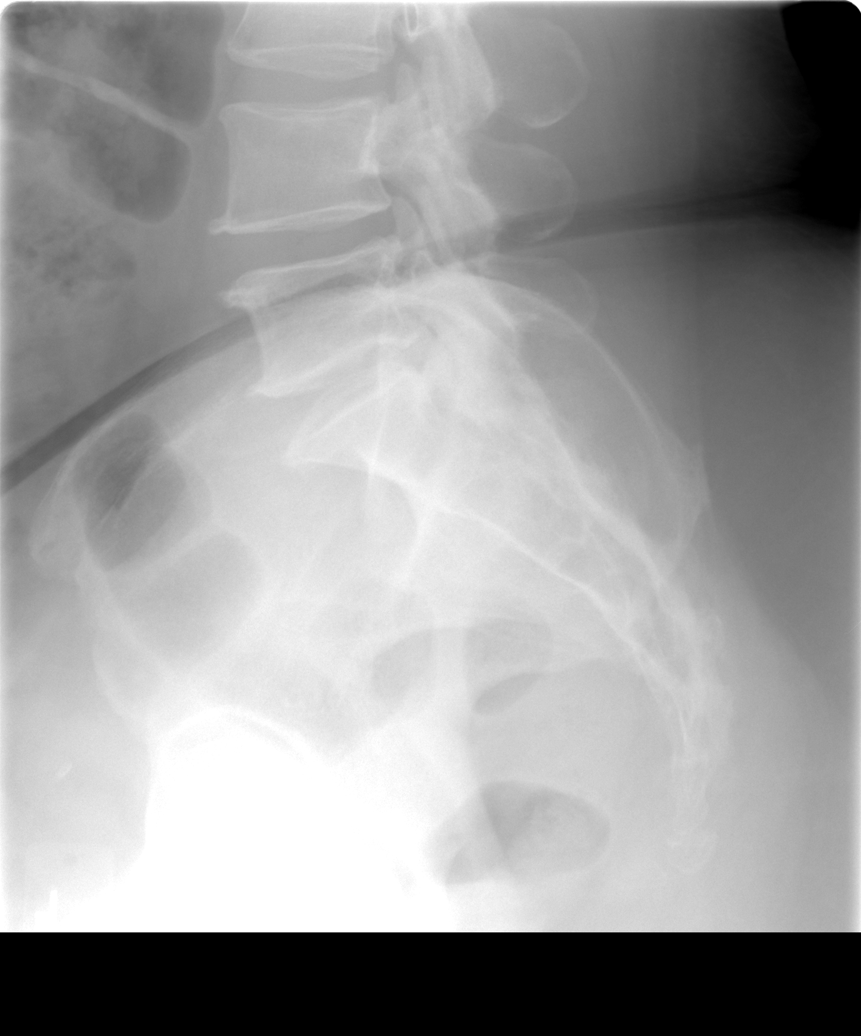

[5 of 5 positions shown; findings below may reference images not displayed]

FINDINGS: The lumbar vertebrae remain in normal alignment with normal
intervertebral disc spaces. No compression deformity is seen. Mild
anterior osteophyte formation is present at L4-5 and L5-S1. On
oblique views no significant abnormality of the facet joints is
seen. The SI joints are well corticated.
IMPRESSION: Normal alignment with no acute abnormality. Mild anterior osteophyte
formation at L4-5 and L5-S1.

## 2017-08-28 ENCOUNTER — Encounter: Payer: Self-pay | Admitting: Internal Medicine

## 2017-08-28 ENCOUNTER — Ambulatory Visit (AMBULATORY_SURGERY_CENTER): Payer: PRIVATE HEALTH INSURANCE | Admitting: Internal Medicine

## 2017-08-28 ENCOUNTER — Other Ambulatory Visit: Payer: Self-pay

## 2017-08-28 VITALS — BP 114/58 | HR 67 | Temp 96.0°F | Resp 14 | Ht 65.0 in | Wt 242.0 lb

## 2017-08-28 DIAGNOSIS — Z1212 Encounter for screening for malignant neoplasm of rectum: Secondary | ICD-10-CM | POA: Diagnosis not present

## 2017-08-28 DIAGNOSIS — D123 Benign neoplasm of transverse colon: Secondary | ICD-10-CM

## 2017-08-28 DIAGNOSIS — Z1211 Encounter for screening for malignant neoplasm of colon: Secondary | ICD-10-CM | POA: Diagnosis present

## 2017-08-28 MED ORDER — SODIUM CHLORIDE 0.9 % IV SOLN: 500.0000 mL | INTRAVENOUS | Status: DC

## 2017-08-28 NOTE — Progress Notes (Signed)
Called to room to assist during endoscopic procedure.  Patient ID and intended procedure confirmed with present staff. Received instructions for my participation in the procedure from the performing physician.  

## 2017-08-28 NOTE — Progress Notes (Signed)
Pt's states no medical or surgical changes since previsit or office visit. 

## 2017-08-28 NOTE — Patient Instructions (Addendum)
I found and removed one tiny polyp that looks benign.  I will let you know pathology results and when to have another routine colonoscopy by mail and/or My Chart.  I appreciate the opportunity to care for you. Gatha Mayer, MD, [facility]  Polyp handout given to patient. Return to previous diet. Continue present medications.  YOU HAD AN ENDOSCOPIC PROCEDURE TODAY AT [facility]:   Refer to the procedure report that was given to you for any specific questions about what was found during the examination.  If the procedure report does not answer your questions, please call your gastroenterologist to clarify.  If you requested that your care partner not be given the details of your procedure findings, then the procedure report has been included in a sealed envelope for you to review at your convenience later.  YOU SHOULD EXPECT: Some feelings of bloating in the abdomen. Passage of more gas than usual.  Walking can help get rid of the air that was put into your GI tract during the procedure and reduce the bloating. If you had a lower endoscopy (such as a colonoscopy or flexible sigmoidoscopy) you may notice spotting of blood in your stool or on the toilet paper. If you underwent a bowel prep for your procedure, you may not have a normal bowel movement for a few days.  Please Note:  You might notice some irritation and congestion in your nose or some drainage.  This is from the oxygen used during your procedure.  There is no need for concern and it should clear up in a day or so.  SYMPTOMS TO REPORT IMMEDIATELY:   Following lower endoscopy (colonoscopy or flexible sigmoidoscopy):  Excessive amounts of blood in the stool  Significant tenderness or worsening of abdominal pains  Swelling of the abdomen that is new, acute  Fever of 100F or higher  For urgent or emergent issues, a gastroenterologist can be reached at any hour by calling [phone].   DIET:  We do  recommend a small meal at first, but then you may proceed to your regular diet.  Drink plenty of fluids but you should avoid alcoholic beverages for 24 hours.  ACTIVITY:  You should plan to take it easy for the rest of today and you should NOT DRIVE or use heavy machinery until tomorrow (because of the sedation medicines used during the test).    FOLLOW UP: Our staff will call the number listed on your records the next business day following your procedure to check on you and address any questions or concerns that you may have regarding the information given to you following your procedure. If we do not reach you, we will leave a message.  However, if you are feeling well and you are not experiencing any problems, there is no need to return our call.  We will assume that you have returned to your regular daily activities without incident.  If any biopsies were taken you will be contacted by phone or by letter within the next 1-3 weeks.  Please call us at [phone] if you have not heard about the biopsies in 3 weeks.    SIGNATURES/CONFIDENTIALITY: You and/or your care partner have signed paperwork which will be entered into your electronic medical record.  These signatures attest to the fact that that the information above on your After Visit Summary has been reviewed and is understood.  Full responsibility of the confidentiality of this discharge information lies with you and/or  your care-partner. 

## 2017-08-28 NOTE — Progress Notes (Signed)
  [facility] Post-op Note  Patient: Meghan Robertson  Procedure(s) Performed: colonoscopy  Patient Location: [facility] - Recovery Area  Anesthesia Type: Deep Sedation/Propofol  Level of Consciousness: awake, oriented and patient cooperative  Airway and Oxygen Therapy: Patient Spontanous Breathing  Post-op Pain: none  Post-op Assessment:  Post-op Vital signs reviewed, Patient's Cardiovascular Status Stable, Respiratory Function Stable, Patent Airway, No signs of Nausea or vomiting and Pain level controlled  Post-op Vital Signs: Reviewed and stable  Complications: No apparent anesthesia complications  Mabel Roll E Elcie Pelster 3:53 PM

## 2017-08-28 NOTE — Op Note (Signed)
[facility] Patient Name: Meghan Robertson Procedure Date: 08/28/2017 3:22 PM MRN: [id number] Endoscopist: Gatha Mayer , MD Age: 51 Referring MD:  Date of Birth: [birth year] Gender: Female Account #: [id number] Procedure:                Colonoscopy Indications:              Screening for colorectal malignant neoplasm, This                            is the patient's first colonoscopy Medicines:                Propofol per Anesthesia, Monitored Anesthesia Care Procedure:                Pre-Anesthesia Assessment:                           - Prior to the procedure, a History and Physical                            was performed, and patient medications and                            allergies were reviewed. The patient's tolerance of                            previous anesthesia was also reviewed. The risks                            and benefits of the procedure and the sedation                            options and risks were discussed with the patient.                            All questions were answered, and informed consent                            was obtained. Prior Anticoagulants: The patient has                            taken no previous anticoagulant or antiplatelet                            agents. ASA Grade Assessment: II - A patient with                            mild systemic disease. After reviewing the risks                            and benefits, the patient was deemed in                            satisfactory condition to undergo the procedure.  After obtaining informed consent, the colonoscope                            was passed under direct vision. Throughout the                            procedure, the patient's blood pressure, pulse, and                            oxygen saturations were monitored continuously. The                            Model PCF-[id number]) scope was introduced   through the anus and advanced to the the cecum,                            identified by appendiceal orifice and ileocecal                            valve. The colonoscopy was performed without                            difficulty. The patient tolerated the procedure                            well. The quality of the bowel preparation was                            excellent. The bowel preparation used was Miralax.                            Anatomical landmarks were photographed. Scope In: 3:34:56 PM Scope Out: 3:47:13 PM Scope Withdrawal Time: 0 hours 10 minutes 13 seconds  Total Procedure Duration: 0 hours 12 minutes 17 seconds  Findings:                 The perianal and digital rectal examinations were                            normal.                           A diminutive polyp was found in the distal                            transverse colon. The polyp was sessile. The polyp                            was removed with a cold snare. Resection and                            retrieval were complete. Verification of patient                            identification for the specimen was done.  Estimated                            blood loss was minimal.                           The exam was otherwise without abnormality on                            direct and retroflexion views. Complications:            No immediate complications. Estimated Blood Loss:     Estimated blood loss was minimal. Impression:               - One diminutive polyp in the distal transverse                            colon, removed with a cold snare. Resected and                            retrieved.                           - The examination was otherwise normal on direct                            and retroflexion views. Recommendation:           - Patient has a contact number available for                            emergencies. The signs and symptoms of potential                            delayed  complications were discussed with the                            patient. Return to normal activities tomorrow.                            Written discharge instructions were provided to the                            patient.                           - Resume previous diet.                           - Continue present medications.                           - Repeat colonoscopy is recommended. The                            colonoscopy date will be determined after pathology  results from today's exam become available for                            review. Gatha Mayer, MD 08/28/2017 3:52:57 PM This report has been signed electronically.

## 2017-08-31 ENCOUNTER — Telehealth: Payer: Self-pay | Admitting: *Deleted

## 2017-08-31 NOTE — Telephone Encounter (Signed)
  Follow up Call-  Call back number 08/28/2017  Post procedure Call Back phone  # [phone]  Permission to leave phone message Yes  Some recent data might be hidden     Patient questions:  Do you have a fever, pain , or abdominal swelling? No. Pain Score  0 *  Have you tolerated food without any problems? yes  Have you been able to return to your normal activities? Yes  Do you have any questions about your discharge instructions: Diet   No. Medications  No. Follow up visit  No.  Do you have questions or concerns about your Care? No.  Actions: * If pain score is 4 or above: No action needed, pain <4.

## 2017-09-02 ENCOUNTER — Encounter: Payer: Self-pay | Admitting: Internal Medicine

## 2017-09-02 DIAGNOSIS — Z86010 Personal history of colonic polyps: Secondary | ICD-10-CM

## 2017-09-02 DIAGNOSIS — Z860101 Personal history of adenomatous and serrated colon polyps: Secondary | ICD-10-CM

## 2017-09-02 HISTORY — DX: Personal history of adenomatous and serrated colon polyps: Z86.0101

## 2017-09-02 HISTORY — DX: Personal history of colonic polyps: Z86.010

## 2017-09-02 NOTE — Progress Notes (Signed)
Diminutive adenoma Recall colon 2025

## 2024-11-05 ENCOUNTER — Encounter: Payer: Self-pay | Admitting: Internal Medicine
